# Patient Record
Sex: Female | Born: 1985 | Race: Black or African American | Hispanic: No | Marital: Married | Smoking: Never smoker
Health system: Southern US, Community
[De-identification: ages and names within clinical notes are randomized; demographics above are authoritative.]

## PROBLEM LIST (undated history)

## (undated) DIAGNOSIS — D219 Benign neoplasm of connective and other soft tissue, unspecified: Secondary | ICD-10-CM

## (undated) HISTORY — PX: NO PAST SURGERIES: SHX2092

---

## 2018-01-06 DIAGNOSIS — O0991 Supervision of high risk pregnancy, unspecified, first trimester: Secondary | ICD-10-CM | POA: Insufficient documentation

## 2018-01-13 LAB — OB RESULTS CONSOLE HGB/HCT, BLOOD
HCT: 37
Hemoglobin: 11.9

## 2018-01-13 LAB — OB RESULTS CONSOLE HIV ANTIBODY (ROUTINE TESTING): HIV: NONREACTIVE

## 2018-01-13 LAB — OB RESULTS CONSOLE GC/CHLAMYDIA
CHLAMYDIA, DNA PROBE: NEGATIVE
Gonorrhea: NEGATIVE

## 2018-01-13 LAB — OB RESULTS CONSOLE PLATELET COUNT: PLATELETS: 295

## 2018-01-13 LAB — OB RESULTS CONSOLE HEPATITIS B SURFACE ANTIGEN: Hepatitis B Surface Ag: NEGATIVE

## 2018-01-13 LAB — OB RESULTS CONSOLE RUBELLA ANTIBODY, IGM: RUBELLA: IMMUNE

## 2018-01-13 LAB — OB RESULTS CONSOLE VARICELLA ZOSTER ANTIBODY, IGG: VARICELLA IGG: IMMUNE

## 2018-01-13 LAB — OB RESULTS CONSOLE RPR: RPR: NONREACTIVE

## 2018-01-26 ENCOUNTER — Emergency Department
Admission: EM | Admit: 2018-01-26 | Discharge: 2018-01-27 | Disposition: A | Discharge status: Home / Self Care | Payer: 59 | Attending: Student in an Organized Health Care Education/Training Program | Admitting: Student in an Organized Health Care Education/Training Program

## 2018-01-26 DIAGNOSIS — O26852 Spotting complicating pregnancy, second trimester: Secondary | ICD-10-CM | POA: Diagnosis not present

## 2018-01-26 DIAGNOSIS — O4690 Antepartum hemorrhage, unspecified, unspecified trimester: Secondary | ICD-10-CM

## 2018-01-26 DIAGNOSIS — Z3A13 13 weeks gestation of pregnancy: Secondary | ICD-10-CM | POA: Insufficient documentation

## 2018-01-26 DIAGNOSIS — R102 Pelvic and perineal pain unspecified side: Secondary | ICD-10-CM

## 2018-01-26 DIAGNOSIS — O200 Threatened abortion: Secondary | ICD-10-CM | POA: Diagnosis not present

## 2018-01-26 DIAGNOSIS — D259 Leiomyoma of uterus, unspecified: Secondary | ICD-10-CM | POA: Diagnosis not present

## 2018-01-26 DIAGNOSIS — O9989 Other specified diseases and conditions complicating pregnancy, childbirth and the puerperium: Secondary | ICD-10-CM | POA: Diagnosis present

## 2018-01-26 HISTORY — DX: Benign neoplasm of connective and other soft tissue, unspecified: D21.9

## 2018-01-26 LAB — BASIC METABOLIC PANEL
ANION GAP: 8 (ref 5–15)
BUN: <5 mg/dL — ABNORMAL LOW (ref 6–20)
CALCIUM: 8.7 mg/dL — AB (ref 8.9–10.3)
CO2: 21 mmol/L — ABNORMAL LOW (ref 22–32)
Chloride: 103 mmol/L (ref 101–111)
Creatinine, Ser: 0.54 mg/dL (ref 0.44–1.00)
GFR calc Af Amer: >60 mL/min (ref >60)
GFR calc non Af Amer: >60 mL/min (ref >60)
GLUCOSE: 97 mg/dL (ref 65–99)
POTASSIUM: 3.2 mmol/L — AB (ref 3.5–5.1)
Sodium: 132 mmol/L — ABNORMAL LOW (ref 135–145)

## 2018-01-26 LAB — POCT PREGNANCY, URINE: PREG TEST UR: POSITIVE — AB

## 2018-01-26 LAB — ABO/RH: ABO/RH(D): A NEG

## 2018-01-26 LAB — CBC
HEMATOCRIT: 35.8 % (ref 35.0–47.0)
HEMOGLOBIN: 11.9 g/dL — AB (ref 12.0–16.0)
MCH: 29.2 pg (ref 26.0–34.0)
MCHC: 33.4 g/dL (ref 32.0–36.0)
MCV: 87.3 fL (ref 80.0–100.0)
Platelets: 284 10*3/uL (ref 150–440)
RBC: 4.09 MIL/uL (ref 3.80–5.20)
RDW: 13.1 % (ref 11.5–14.5)
WBC: 13.4 10*3/uL — ABNORMAL HIGH (ref 3.6–11.0)

## 2018-01-26 LAB — HCG, QUANTITATIVE, PREGNANCY: HCG, BETA CHAIN, QUANT, S: 207929 m[IU]/mL — AB (ref <5)

## 2018-01-26 MED ORDER — ACETAMINOPHEN 325 MG PO TABS
650.0000 mg | ORAL_TABLET | Freq: Once | ORAL | Status: AC
  Administered 2018-01-26: 650 mg via ORAL
  Filled 2018-01-26: qty 2

## 2018-01-26 NOTE — ED Provider Notes (Signed)
[facility] Emergency Department Provider Note    First MD Initiated Contact with Patient 01/26/18 2231     (approximate)  I have reviewed the triage vital signs and the nursing notes.   HISTORY  Chief Complaint Vaginal Bleeding ([redacted] weeks pregnant)    HPI Catherine Mclaughlin is a 32 y.o. female G2P1 [redacted] weeks pregnant presents for pelvic cramping that started over the weekend and is now having light bleeding.  Denies any fevers no nausea or vomiting.  States that the pain is mild to moderate.  Denies any trauma.  Is never had pain like this before.  Tylenol is not improving the pain.  Is not passing any clots or products of conception.  Past Medical History:  Diagnosis Date  . Fibroids    No family history on file. History reviewed. No pertinent surgical history. There are no active problems to display for this patient.     Prior to Admission medications   Not on File    Allergies Patient has no known allergies.    Social History Social History   Tobacco Use  . Smoking status: Never Smoker  . Smokeless tobacco: Never Used  Substance Use Topics  . Alcohol use: Yes    Frequency: Never    Comment: occassional  . Drug use: Not on file    Review of Systems Patient denies headaches, rhinorrhea, blurry vision, numbness, shortness of breath, chest pain, edema, cough, abdominal pain, nausea, vomiting, diarrhea, dysuria, fevers, rashes or hallucinations unless otherwise stated above in HPI. ____________________________________________   PHYSICAL EXAM:  VITAL SIGNS: Vitals:   01/26/18 2127  BP: 134/88  Pulse: 94  Resp: 18  Temp: 99.6 F (37.6 C)  SpO2: 99%    Constitutional: Alert and oriented. Well appearing and in no acute distress. Eyes: Conjunctivae are normal.  Head: Atraumatic. Nose: No congestion/rhinnorhea. Mouth/Throat: Mucous membranes are moist.   Neck: No stridor. Painless ROM.  Cardiovascular: Normal rate, regular  rhythm. Grossly normal heart sounds.  Good peripheral circulation. Respiratory: Normal respiratory effort.  No retractions. Lungs CTAB. Gastrointestinal: Soft but with ttp to right lower quadrant. No distention. No abdominal bruits. No CVA tenderness. Genitourinary: deferred Musculoskeletal: No lower extremity tenderness nor edema.  No joint effusions. Neurologic:  Normal speech and language. No gross focal neurologic deficits are appreciated. No facial droop Skin:  Skin is warm, dry and intact. No rash noted. Psychiatric: Mood and affect are normal. Speech and behavior are normal.  ____________________________________________   LABS (all labs ordered are listed, but only abnormal results are displayed)  Results for orders placed or performed during the hospital encounter of 01/26/18 (from the past 24 hour(s))  ABO/Rh     Status: None   Collection Time: 01/26/18  9:24 PM  Result Value Ref Range   ABO/RH(D)      A NEG Performed at [facility], [address] [place], [place] [address]   CBC     Status: Abnormal   Collection Time: 01/26/18  9:24 PM  Result Value Ref Range   WBC 13.4 (H) 3.6 - 11.0 K/uL   RBC 4.09 3.80 - 5.20 MIL/uL   Hemoglobin 11.9 (L) 12.0 - 16.0 g/dL   HCT 35.8 35.0 - 47.0 %   MCV 87.3 80.0 - 100.0 fL   MCH 29.2 26.0 - 34.0 pg   MCHC 33.4 32.0 - 36.0 g/dL   RDW 13.1 11.5 - 14.5 %   Platelets 284 150 - 440 K/uL  Pregnancy, urine  POC     Status: Abnormal   Collection Time: 01/26/18  9:39 PM  Result Value Ref Range   Preg Test, Ur POSITIVE (A) NEGATIVE   ____________________________________________ ____________________________________________  RADIOLOGY  I personally reviewed all radiographic images ordered to evaluate for the above acute complaints and reviewed radiology reports and findings.  These findings were personally discussed with the patient.  Please see medical record for radiology  report.  ____________________________________________   PROCEDURES  Procedure(s) performed:  Procedures    Critical Care performed: no ____________________________________________   INITIAL IMPRESSION / ASSESSMENT AND PLAN / ED COURSE  Pertinent labs & imaging results that were available during my care of the patient were reviewed by me and considered in my medical decision making (see chart for details).  DDX: Threatened miscarriage, placental abruption, subchorionic hematoma, uterine rupture, appendicitis, torsion  Catherine Mclaughlin is a 32 y.o. who presents to the ED with vaginal bleeding in early pregnancy as described above.  Patient does have some tenderness particular on the right suprapubic region.  Bedside ultrasound is reassuring fetal heart tones however the patient is tender in the right suprapubic region.  No trauma to suggest abruption or rupture patient otherwise hemodynamically stable..  Will order formal ultrasound to further evaluate.  Patient will require signout to oncoming physician pending reassessment and follow-up of ultrasound results.      ____________________________________________   FINAL CLINICAL IMPRESSION(S) / ED DIAGNOSES  Final diagnoses:  Pelvic pain  Vaginal bleeding in pregnancy      NEW MEDICATIONS STARTED DURING THIS VISIT:  New Prescriptions   No medications on file     Note:  This document was prepared using Dragon voice recognition software and may include unintentional dictation errors.    Merlyn Lot, MD 01/27/18 [phone]

## 2018-01-26 NOTE — ED Notes (Signed)
Bedside US by EDP.  Fetus could be seen moving and kicking on [place].

## 2018-01-26 NOTE — ED Triage Notes (Signed)
Patient c/o light vaginal bleeding and lower abdominal pain beginning at approx 2000 today. Patient denies clots/heavy bleeding

## 2018-01-27 ENCOUNTER — Emergency Department: Payer: 59

## 2018-01-27 LAB — ANTIBODY SCREEN: Antibody Screen: NEGATIVE

## 2018-01-27 LAB — URINALYSIS, COMPLETE (UACMP) WITH MICROSCOPIC
BILIRUBIN URINE: NEGATIVE
Glucose, UA: NEGATIVE mg/dL
Ketones, ur: NEGATIVE mg/dL
NITRITE: NEGATIVE
PH: 6 (ref 5.0–8.0)
Protein, ur: NEGATIVE mg/dL
SPECIFIC GRAVITY, URINE: 1.004 — AB (ref 1.005–1.030)

## 2018-01-27 MED ORDER — RHO D IMMUNE GLOBULIN 1500 UNIT/2ML IJ SOSY
300.0000 ug | PREFILLED_SYRINGE | Freq: Once | INTRAMUSCULAR | Status: DC
  Filled 2018-01-27: qty 2

## 2018-01-27 MED ORDER — FENTANYL CITRATE (PF) 100 MCG/2ML IJ SOLN: 50.0000 ug | INTRAMUSCULAR | Status: DC | PRN

## 2018-01-27 MED ORDER — RHO D IMMUNE GLOBULIN 1500 UNIT/2ML IJ SOSY
300.0000 ug | PREFILLED_SYRINGE | Freq: Once | INTRAMUSCULAR | Status: AC
  Administered 2018-01-27: 300 ug via INTRAMUSCULAR
  Filled 2018-01-27: qty 2

## 2018-01-27 NOTE — ED Notes (Signed)
Pt to MRI

## 2018-01-27 NOTE — ED Provider Notes (Signed)
-----------------------------------------   6:18 AM on 01/27/2018 -----------------------------------------   Blood pressure 110/71, pulse 82, temperature 99.6 F (37.6 C), temperature source Oral, resp. rate 17, height 5\' 4"  (1.626 m), weight 65.8 kg (145 lb), last menstrual period 10/30/2017, SpO2 99 %.  Assuming care from Dr. Quentin Cornwall.  In short, Catherine Mclaughlin is a 32 y.o. female with a chief complaint of Vaginal Bleeding ([redacted] weeks pregnant) .  Refer to the original H&P for additional details.  The current plan of care is to follow up the ultrasound and disposition the patient appropriately.  Pelvic ultrasound: Single live intrauterine pregnancy estimated gestational age 39 days 1 week, no subchorionic hemorrhage, normal left ovary with blood flow, right ovary not visualized, multiple uterine fibroids including a large fundal fibroid  The patient's ultrasound was unremarkable aside from fibroids.  Dr. Quentin Cornwall had been turned about the patient's right lower quadrant pain.  He did recommend a possible MRI if the patient's ultrasound was negative.  I did order an abdominal MRI for the patient.  MRI abdomen: No evidence of acute appendicitis.  The patient will be discharged home to follow-up with her primary care physician as well as her OB/GYN.  The patient has no further complaints or concerns.        Loney Hering, MD 01/27/18 [phone]

## 2018-01-27 NOTE — ED Notes (Signed)
Spoke with pt regarding needing to start and IV for MRI.  Pt states she was not told about MRI by Dr. Quentin Cornwall and would like to speak with a physician first.  EDP notified.

## 2018-01-27 NOTE — ED Notes (Signed)
Patient is resting comfortably. 

## 2018-01-27 NOTE — Discharge Instructions (Signed)
Please follow-up with your OB/GYN or your midwife.  Please return with any other questions or concerns.

## 2018-01-28 LAB — RHOGAM INJECTION: Unit division: 0

## 2018-02-24 ENCOUNTER — Encounter: Payer: Self-pay | Admitting: Obstetrics and Gynecology

## 2018-02-24 ENCOUNTER — Ambulatory Visit (INDEPENDENT_AMBULATORY_CARE_PROVIDER_SITE_OTHER): Payer: 59 | Admitting: Obstetrics and Gynecology

## 2018-02-24 VITALS — BP 105/70 | HR 84 | Wt 147.3 lb

## 2018-02-24 DIAGNOSIS — O468X1 Other antepartum hemorrhage, first trimester: Secondary | ICD-10-CM

## 2018-02-24 DIAGNOSIS — Z8742 Personal history of other diseases of the female genital tract: Secondary | ICD-10-CM

## 2018-02-24 DIAGNOSIS — D259 Leiomyoma of uterus, unspecified: Secondary | ICD-10-CM | POA: Insufficient documentation

## 2018-02-24 DIAGNOSIS — O418X10 Other specified disorders of amniotic fluid and membranes, first trimester, not applicable or unspecified: Secondary | ICD-10-CM

## 2018-02-24 DIAGNOSIS — O3412 Maternal care for benign tumor of corpus uteri, second trimester: Secondary | ICD-10-CM

## 2018-02-24 DIAGNOSIS — Z6791 Unspecified blood type, Rh negative: Secondary | ICD-10-CM

## 2018-02-24 DIAGNOSIS — O0992 Supervision of high risk pregnancy, unspecified, second trimester: Secondary | ICD-10-CM

## 2018-02-24 DIAGNOSIS — O26899 Other specified pregnancy related conditions, unspecified trimester: Secondary | ICD-10-CM

## 2018-02-24 LAB — POCT URINALYSIS DIPSTICK
Bilirubin, UA: NEGATIVE
GLUCOSE UA: NEGATIVE
Ketones, UA: NEGATIVE
Nitrite, UA: NEGATIVE
Spec Grav, UA: 1.02 (ref 1.010–1.025)
Urobilinogen, UA: 0.2 E.U./dL
pH, UA: 6.5 (ref 5.0–8.0)

## 2018-02-24 NOTE — Patient Instructions (Signed)
Second Trimester of Pregnancy The second trimester is from week 13 through week 28, month 4 through 6. This is often the time in pregnancy that you feel your best. Often times, morning sickness has lessened or quit. You may have more energy, and you may get hungry more often. Your unborn baby (fetus) is growing rapidly. At the end of the sixth month, he or she is about 9 inches long and weighs about 1 pounds. You will likely feel the baby move (quickening) between 18 and 20 weeks of pregnancy. Follow these instructions at home:  Avoid all smoking, herbs, and alcohol. Avoid drugs not approved by your doctor.  Do not use any tobacco products, including cigarettes, chewing tobacco, and electronic cigarettes. If you need help quitting, ask your doctor. You may get counseling or other support to help you quit.  Only take medicine as told by your doctor. Some medicines are safe and some are not during pregnancy.  Exercise only as told by your doctor. Stop exercising if you start having cramps.  Eat regular, healthy meals.  Wear a good support bra if your breasts are tender.  Do not use hot tubs, steam rooms, or saunas.  Wear your seat belt when driving.  Avoid raw meat, uncooked cheese, and liter boxes and soil used by cats.  Take your prenatal vitamins.  Take 1500-2000 milligrams of calcium daily starting at the 20th week of pregnancy until you deliver your baby.  Try taking medicine that helps you poop (stool softener) as needed, and if your doctor approves. Eat more fiber by eating fresh fruit, vegetables, and whole grains. Drink enough fluids to keep your pee (urine) clear or pale yellow.  Take warm water baths (sitz baths) to soothe pain or discomfort caused by hemorrhoids. Use hemorrhoid cream if your doctor approves.  If you have puffy, bulging veins (varicose veins), wear support hose. Raise (elevate) your feet for 15 minutes, 3-4 times a day. Limit salt in your diet.  Avoid heavy  lifting, wear low heals, and sit up straight.  Rest with your legs raised if you have leg cramps or low back pain.  Visit your dentist if you have not gone during your pregnancy. Use a soft toothbrush to brush your teeth. Be gentle when you floss.  You can have sex (intercourse) unless your doctor tells you not to.  Go to your doctor visits. Get help if:  You feel dizzy.  You have mild cramps or pressure in your lower belly (abdomen).  You have a nagging pain in your belly area.  You continue to feel sick to your stomach (nauseous), throw up (vomit), or have watery poop (diarrhea).  You have bad smelling fluid coming from your vagina.  You have pain with peeing (urination). Get help right away if:  You have a fever.  You are leaking fluid from your vagina.  You have spotting or bleeding from your vagina.  You have severe belly cramping or pain.  You lose or gain weight rapidly.  You have trouble catching your breath and have chest pain.  You notice sudden or extreme puffiness (swelling) of your face, hands, ankles, feet, or legs.  You have not felt the baby move in over an hour.  You have severe headaches that do not go away with medicine.  You have vision changes. This information is not intended to replace advice given to you by your health care provider. Make sure you discuss any questions you have with your health care   provider. Document Released: 02/05/2010 Document Revised: 04/18/2016 Document Reviewed: 01/12/2013 Elsevier Interactive Patient Education  2017 Elsevier Inc.  

## 2018-02-24 NOTE — Progress Notes (Signed)
OB pt noticed bleeding about a month ago. She has fibroids and wondering how they would impact pregnancy.

## 2018-02-24 NOTE — Progress Notes (Signed)
TRANSFER IN OB HISTORY AND PHYSICAL         Shiree Altemus is a 32 y.o. G2P1 female, Patient's last menstrual period was 10/30/2017., Estimated Date of Delivery: 08/06/18 (consistent with 7 week ultrasound), [redacted]w[redacted]d, presents today for Transition of Prenatal Care. EPIC data migration from outside records is accomplished today.  She was previously receiving prenatal care at [facility]. Conceived with assisted reproduction (ovulation induction and IUI performed at [facility]).  Complaints today include: None.     Patient Active Problem List   Diagnosis Date Noted  . Subchorionic hemorrhage in first trimester 02/28/2018  . History of infertility 02/28/2018  . Uterine fibroids affecting pregnancy in second trimester 02/24/2018  . Rh negative state in antepartum period 02/24/2018  . Supervision of high risk pregnancy in first trimester 01/06/2018    Gynecologic History Patient's last menstrual period was 10/30/2017. Normal Contraception: none Last Pap: 04/2017. Results were: normal.  She denies h/o abnormal pap smears.  Denies h/o STI's.   Obstetric History OB History  Gravida Para Term Preterm AB Living  2 0     1    SAB TAB Ectopic Multiple Live Births    1          # Outcome Date GA Lbr Len/2nd Weight Sex Delivery Anes PTL Lv  2 Current           1 TAB 2009            Past Medical History:  Diagnosis Date  . Fibroids     Past Surgical History:  Procedure Laterality Date  . NO PAST SURGERIES      Family History  Problem Relation Age of Onset  . Diabetes Mother   . Hypertension Mother   . Diabetes Father   . Stroke Father   . Asthma Sister   . Stroke Paternal Grandfather     Social History   Socioeconomic History  . Marital status: Married    Spouse name: Not on file  . Number of children: Not on file  . Years of education: Not on file  . Highest education level: Not on file  Occupational History  . Not on file  Social Needs  . Financial resource strain: Not  on file  . Food insecurity:    Worry: Not on file    Inability: Not on file  . Transportation needs:    Medical: Not on file    Non-medical: Not on file  Tobacco Use  . Smoking status: Never Smoker  . Smokeless tobacco: Never Used  Substance and Sexual Activity  . Alcohol use: Yes    Frequency: Never    Comment: occassional  . Drug use: Not on file  . Sexual activity: Not on file  Lifestyle  . Physical activity:    Days per week: Not on file    Minutes per session: Not on file  . Stress: Not on file  Relationships  . Social connections:    Talks on phone: Not on file    Gets together: Not on file    Attends religious service: Not on file    Active member of club or organization: Not on file    Attends meetings of clubs or organizations: Not on file    Relationship status: Not on file  . Intimate partner violence:    Fear of current or ex partner: Not on file    Emotionally abused: Not on file    Physically abused: Not on file  Forced sexual activity: Not on file  Other Topics Concern  . Not on file  Social History Narrative  . Not on file    Current Outpatient Medications on File Prior to Visit  Medication Sig Dispense Refill  . Prenatal Vit-Fe Fumarate-FA (PRENATAL MULTIVITAMIN) TABS tablet Take 1 tablet by mouth daily at 12 noon.     No current facility-administered medications on file prior to visit.     No Known Allergies   The following portions of the patient's history were reviewed and updated as appropriate: allergies, current medications, past OB history, past medical history, past surgical history, past family history, past social history, and problem list.    Review of Symptoms General:Not Present- Fever, Weight Loss and Weight Gain. Skin:Not Present- Rash. HEENT:Not Present- Blurred Vision, Headache and Bleeding Gums. Respiratory:Not Present- Difficulty Breathing. Breast:Not Present- Breast Mass. Cardiovascular:Not Present- Chest Pain,  Elevated Blood Pressure, Fainting / Blacking Out and Shortness of Breath. Gastrointestinal:Not Present- Abdominal Pain, Constipation, Nausea and Vomiting. Female Genitourinary:Vaginal Bleeding (patient notes an episode ~ 1 month ago, went to the Emergency Room, was noted to have subchorionic hemorrhage).  Received Rhogam. Not Present- Frequency, Painful Urination, Pelvic Pain, Vaginal Discharge, Contractions, regular, Fetal Movements Decreased, Urinary Complaints and Vaginal Fluid. Musculoskeletal:Not Present- Back Pain and Leg Cramps. Neurological:Not Present- Dizziness. Psychiatric:Not Present- Depression.    OBJECTIVE: Initial Physical Exam (New OB)  GENERAL APPEARANCE: alert, well appearing, in no apparent distress HEAD: normocephalic, atraumatic MOUTH: mucous membranes moist, pharynx normal without lesions THYROID: no thyromegaly or masses present BREASTS: no masses noted, no significant tenderness, no palpable axillary nodes, no skin changes LUNGS: clear to auscultation, no wheezes, rales or rhonchi, symmetric air entry HEART: regular rate and rhythm, no murmurs ABDOMEN: soft, nontender, nondistended, no epigastric pain. FHT 149 bpm.  FH 24 cm, with irregular contour, deviated slightly to right. Marland Kitchen  EXTREMITIES: no redness or tenderness in the calves or thighs SKIN: normal coloration and turgor, no rashes LYMPH NODES: no adenopathy palpable NEUROLOGIC: alert, oriented, normal speech, no focal findings or movement disorder noted  PELVIC EXAM: Deferred    Labs: Reviewed in Care Everywhere   Imaging:  CLINICAL DATA:  Pregnant patient in first-trimester pregnancy with lower abdominal pain and vaginal bleeding.  EXAM: OBSTETRIC <14 WK [place]  US DOPPLER ULTRASOUND OF OVARIES  TECHNIQUE: Transabdominal ultrasound examination was performed for complete evaluation of the gestation as well as the maternal uterus, adnexal regions, and pelvic cul-de-sac.  Color and duplex  Doppler ultrasound was utilized to evaluate blood flow to the ovaries.  COMPARISON:  None.  FINDINGS: Intrauterine gestational sac: Single  Yolk sac:  Not Visualized, normal for gestational age.  Embryo:  Visualized.  Cardiac Activity: Visualized.  Heart Rate: 169 bpm  CRL:   70 mm   13 w 1 d                  [place] EDC: 08/03/2018  Subchorionic hemorrhage:  None visualized.  Maternal uterus/adnexae: There are multiple uterine fibroids, largest about the fundus measuring 9 x 8.7 x 12.6 cm. Probable exophytic fibroid in the midline cul-de-sac measuring 5.1 x 4.6 x 5.3 cm. Larger fibroids demonstrate expected and normal blood flow. the right ovary is not visualized. The left ovary is normal measuring 3.0 x 2.0 x 2.4 cm with normal ovarian blood flow. Trace pelvic free fluid. No adnexal mass.  Pulsed Doppler evaluation of the left ovary demonstrates normal appearing low-resistance arterial and venous waveforms.  IMPRESSION: 1. Single live  intrauterine pregnancy estimated gestational age [redacted] weeks 1 day based on crown-rump length for estimated date of delivery 08/03/2018. No subchorionic hemorrhage. 2. Normal left ovary with blood flow.  Right ovary not visualized. 3. Multiple uterine fibroids including a large fundal fibroid measuring up to 12 cm.   Electronically Signed   By: Jeb Levering M.D.   On: 01/27/2018 01:45   ASSESSMENT: Pregnancy at [redacted] weeks gestation H/o infertility Fibroid uterus Rh negative state H/o subchorionic hemorrhage in 1st trimester   PLAN:  1. Pregnancy at [redacted] weeks gestation - Initial labs drawn. - Prenatal vitamins. - Problem list reviewed and updated. - Genetic testing discussed.  Initially patient had declined genetic testing, however after further discussion patient and husband agree to testing today.  Desire cell-free DNA testing. Panorama ordered.  Role of ultrasound in pregnancy discussed; fetal survey: ordered. -  Encouraged patient to consider breastfeeding postpartum.   2. H/o infertility  - Pregnancy conceived from IUI and ovulation induction.  Currently out of 1st trimester where most miscarriages occur. Will continue to monitor.   3. Fibroid uterus - Patient with enlarged fibroid uterus.  Discussed that she will need serial growth scan q 4-6 weeks to monitor fetal growth after 20 weeks. Discussed issues regarding large fibroids, location, and how this can affect mode of delivery.  Can determine this further during the pregnancy.  Also discussed risk of premature/preterm labor due to size constraints if pregnancy continues on.   4. Rh negative state  - Patient received Rhogam in 1st trimester due to bleeding (likely secondary to subchorionic hemorrhage).  Will give again in third trimester. Discussed blood type with patient.   5. H/o subchorionic hemorrhage in 1st trimester  - Apparently resolved on last scan performed on 01/27/2018. Patient should not have any further episodes of bleeding.    Follow up in 4 weeks.  50% of 30 min visit spent on counseling and coordination of care.    Rubie Maid, MD [facility]

## 2018-02-28 ENCOUNTER — Encounter: Payer: Self-pay | Admitting: Obstetrics and Gynecology

## 2018-02-28 DIAGNOSIS — O208 Other hemorrhage in early pregnancy: Secondary | ICD-10-CM | POA: Insufficient documentation

## 2018-02-28 DIAGNOSIS — Z8742 Personal history of other diseases of the female genital tract: Secondary | ICD-10-CM | POA: Insufficient documentation

## 2018-02-28 DIAGNOSIS — O468X1 Other antepartum hemorrhage, first trimester: Secondary | ICD-10-CM

## 2018-02-28 DIAGNOSIS — O418X10 Other specified disorders of amniotic fluid and membranes, first trimester, not applicable or unspecified: Secondary | ICD-10-CM | POA: Insufficient documentation

## 2018-03-24 ENCOUNTER — Ambulatory Visit (INDEPENDENT_AMBULATORY_CARE_PROVIDER_SITE_OTHER): Payer: 59

## 2018-03-24 ENCOUNTER — Ambulatory Visit (INDEPENDENT_AMBULATORY_CARE_PROVIDER_SITE_OTHER): Payer: 59 | Admitting: Obstetrics and Gynecology

## 2018-03-24 ENCOUNTER — Encounter: Payer: Self-pay | Admitting: Obstetrics and Gynecology

## 2018-03-24 VITALS — BP 106/71 | HR 85 | Wt 147.1 lb

## 2018-03-24 DIAGNOSIS — B3731 Acute candidiasis of vulva and vagina: Secondary | ICD-10-CM

## 2018-03-24 DIAGNOSIS — O0992 Supervision of high risk pregnancy, unspecified, second trimester: Secondary | ICD-10-CM

## 2018-03-24 DIAGNOSIS — B373 Candidiasis of vulva and vagina: Secondary | ICD-10-CM

## 2018-03-24 LAB — POCT URINALYSIS DIPSTICK
BILIRUBIN UA: NEGATIVE
Glucose, UA: NEGATIVE
Ketones, UA: NEGATIVE
Nitrite, UA: NEGATIVE
ODOR: NEGATIVE
PH UA: 6 (ref 5.0–8.0)
Protein, UA: NEGATIVE
SPEC GRAV UA: 1.015 (ref 1.010–1.025)
UROBILINOGEN UA: 0.2 U/dL

## 2018-03-24 NOTE — Progress Notes (Signed)
Catherine Mclaughlin and ant- c/o green d/c x 2 weeks- 2-3 days a week. No odor. No itching or burning.

## 2018-03-24 NOTE — Progress Notes (Signed)
ROB: Ultrasound today -large uterine fibroids.  Patient reports active fetal movement.  Complains of a thick green vaginal discharge otherwise asymptomatic. WET PREP: clue cells: absent, KOH (yeast): positive, odor: absent and trichomoniasis: negative Ph:  < 4.5 Discussed the use of Monistat.

## 2018-03-26 ENCOUNTER — Telehealth: Payer: Self-pay | Admitting: Obstetrics and Gynecology

## 2018-03-26 NOTE — Telephone Encounter (Signed)
The patient called and stated that she is experiencing Severe cramping that has been continuing to happen for some time now. The patient is currently 21 wks preg, and would like to speak with a  Nurse as soon as possible. Please advise.

## 2018-03-26 NOTE — Telephone Encounter (Signed)
OB- c/o of the last 2 days with intense/ sharp pain that comes and goes. Can last up to 30 minutes. She does have abd pain with urination and bm. Dx with y/i on Monday at rob appt. Has not gotten monistat. NO vb. Pos FM. Pos h20. No n/v/d. Took 1 tylenol with no relief. Advised pt to self treat with monistat. Push fluids. Use belly band. Try tylenol 2 es q6. If no better in 72 hours contact office for an appt.

## 2018-04-21 ENCOUNTER — Ambulatory Visit (INDEPENDENT_AMBULATORY_CARE_PROVIDER_SITE_OTHER): Payer: 59 | Admitting: Obstetrics and Gynecology

## 2018-04-21 VITALS — BP 105/69 | HR 102 | Wt 153.1 lb

## 2018-04-21 DIAGNOSIS — Z130 Encounter for screening for diseases of the blood and blood-forming organs and certain disorders involving the immune mechanism: Secondary | ICD-10-CM

## 2018-04-21 DIAGNOSIS — D259 Leiomyoma of uterus, unspecified: Secondary | ICD-10-CM

## 2018-04-21 DIAGNOSIS — O99619 Diseases of the digestive system complicating pregnancy, unspecified trimester: Secondary | ICD-10-CM

## 2018-04-21 DIAGNOSIS — K219 Gastro-esophageal reflux disease without esophagitis: Secondary | ICD-10-CM

## 2018-04-21 DIAGNOSIS — O3410 Maternal care for benign tumor of corpus uteri, unspecified trimester: Secondary | ICD-10-CM

## 2018-04-21 DIAGNOSIS — Z3482 Encounter for supervision of other normal pregnancy, second trimester: Secondary | ICD-10-CM

## 2018-04-21 DIAGNOSIS — Z113 Encounter for screening for infections with a predominantly sexual mode of transmission: Secondary | ICD-10-CM

## 2018-04-21 DIAGNOSIS — Z131 Encounter for screening for diabetes mellitus: Secondary | ICD-10-CM

## 2018-04-21 LAB — POCT URINALYSIS DIPSTICK
Bilirubin, UA: NEGATIVE
Blood, UA: NEGATIVE
Glucose, UA: NEGATIVE
Ketones, UA: NEGATIVE
NITRITE UA: NEGATIVE
PH UA: 6 (ref 5.0–8.0)
PROTEIN UA: NEGATIVE
SPEC GRAV UA: 1.01 (ref 1.010–1.025)
UROBILINOGEN UA: 0.2 U/dL

## 2018-04-21 MED ORDER — RANITIDINE HCL 150 MG PO TABS: 150.0000 mg | ORAL_TABLET | Freq: Two times a day (BID) | ORAL | 4 refills | Status: AC

## 2018-04-21 NOTE — Progress Notes (Signed)
ROB-pt stated that she has had some pain across both sides of her body, with leg cramps. Pt feeling tried all the time.

## 2018-04-21 NOTE — Progress Notes (Signed)
ROB: Patient c/o mild occasional crampy pain, also cramps in legs. Advised on hydration. Taking Tums for reflux and leg cramps, but not helping as much for reflux. Prescribed Zantac. RTC in 1.5 weeks for ultrasound (for large fibroid and 1 hr glucola. RTC in 4 weeks for OB visit.

## 2018-05-05 ENCOUNTER — Ambulatory Visit (INDEPENDENT_AMBULATORY_CARE_PROVIDER_SITE_OTHER): Payer: 59

## 2018-05-05 ENCOUNTER — Ambulatory Visit: Payer: 59

## 2018-05-05 DIAGNOSIS — D259 Leiomyoma of uterus, unspecified: Secondary | ICD-10-CM

## 2018-05-05 DIAGNOSIS — O3410 Maternal care for benign tumor of corpus uteri, unspecified trimester: Secondary | ICD-10-CM

## 2018-05-06 LAB — CBC
HEMATOCRIT: 30.6 % — AB (ref 34.0–46.6)
Hemoglobin: 10.2 g/dL — ABNORMAL LOW (ref 11.1–15.9)
MCH: 28.7 pg (ref 26.6–33.0)
MCHC: 33.3 g/dL (ref 31.5–35.7)
MCV: 86 fL (ref 79–97)
Platelets: 308 10*3/uL (ref 150–450)
RBC: 3.56 x10E6/uL — AB (ref 3.77–5.28)
RDW: 14.8 % (ref 12.3–15.4)
WBC: 11 10*3/uL — AB (ref 3.4–10.8)

## 2018-05-06 LAB — RPR: RPR Ser Ql: NONREACTIVE

## 2018-05-11 ENCOUNTER — Other Ambulatory Visit: Payer: Self-pay | Admitting: Obstetrics and Gynecology

## 2018-05-11 MED ORDER — DOCUSATE SODIUM 100 MG PO CAPS: 100.0000 mg | ORAL_CAPSULE | Freq: Two times a day (BID) | ORAL | 2 refills | Status: AC | PRN

## 2018-05-11 MED ORDER — FERROUS SULFATE 325 (65 FE) MG PO TABS: 325.0000 mg | ORAL_TABLET | Freq: Every day | ORAL | 1 refills | Status: AC

## 2018-05-13 ENCOUNTER — Telehealth: Payer: Self-pay | Admitting: Obstetrics and Gynecology

## 2018-05-13 NOTE — Telephone Encounter (Signed)
Spoke with pt and advised her to got the ED to be seen by L&D.

## 2018-05-13 NOTE — Telephone Encounter (Signed)
Patient would like to speak to the nurse about some symptoms; accellerated pulse, shortness of breath and a little lightheaded for 2-3 hours and SOB seems to be getting worse and she is resting; no activity to cause it.  No pain but intense [employer].  Please call her back at [phone]; does have voicemail.  Please advise, thanks.

## 2018-05-19 ENCOUNTER — Encounter: Payer: Self-pay | Admitting: Obstetrics and Gynecology

## 2018-05-19 ENCOUNTER — Ambulatory Visit (INDEPENDENT_AMBULATORY_CARE_PROVIDER_SITE_OTHER): Payer: 59 | Admitting: Obstetrics and Gynecology

## 2018-05-19 VITALS — BP 105/71 | HR 96 | Wt 155.4 lb

## 2018-05-19 DIAGNOSIS — O360130 Maternal care for anti-D [Rh] antibodies, third trimester, not applicable or unspecified: Secondary | ICD-10-CM

## 2018-05-19 DIAGNOSIS — R42 Dizziness and giddiness: Secondary | ICD-10-CM

## 2018-05-19 DIAGNOSIS — Z1389 Encounter for screening for other disorder: Secondary | ICD-10-CM

## 2018-05-19 DIAGNOSIS — O3413 Maternal care for benign tumor of corpus uteri, third trimester: Secondary | ICD-10-CM

## 2018-05-19 DIAGNOSIS — Z331 Pregnant state, incidental: Secondary | ICD-10-CM

## 2018-05-19 DIAGNOSIS — O3410 Maternal care for benign tumor of corpus uteri, unspecified trimester: Secondary | ICD-10-CM

## 2018-05-19 DIAGNOSIS — D259 Leiomyoma of uterus, unspecified: Secondary | ICD-10-CM

## 2018-05-19 DIAGNOSIS — Z3483 Encounter for supervision of other normal pregnancy, third trimester: Secondary | ICD-10-CM

## 2018-05-19 DIAGNOSIS — O9989 Other specified diseases and conditions complicating pregnancy, childbirth and the puerperium: Secondary | ICD-10-CM

## 2018-05-19 DIAGNOSIS — Z3A28 28 weeks gestation of pregnancy: Secondary | ICD-10-CM | POA: Diagnosis not present

## 2018-05-19 LAB — POCT URINALYSIS DIPSTICK
Bilirubin, UA: NEGATIVE
Blood, UA: NEGATIVE
GLUCOSE UA: NEGATIVE
Ketones, UA: NEGATIVE
NITRITE UA: NEGATIVE
PROTEIN UA: POSITIVE — AB
SPEC GRAV UA: 1.025 (ref 1.010–1.025)
Urobilinogen, UA: 0.2 E.U./dL
pH, UA: 6 (ref 5.0–8.0)

## 2018-05-19 MED ORDER — RHO D IMMUNE GLOBULIN 1500 UNIT/2ML IJ SOSY
300.0000 ug | PREFILLED_SYRINGE | Freq: Once | INTRAMUSCULAR | Status: AC
  Administered 2018-05-19: 300 ug via INTRAMUSCULAR

## 2018-05-19 MED ORDER — TETANUS-DIPHTH-ACELL PERTUSSIS 5-2.5-18.5 LF-MCG/0.5 IM SUSP
0.5000 mL | Freq: Once | INTRAMUSCULAR | Status: AC
  Administered 2018-05-19: 0.5 mL via INTRAMUSCULAR

## 2018-05-19 NOTE — Progress Notes (Signed)
ROB-pt stated having abd contractions that really hurts. Pt stated feeling dizzy and sob throughout the day.

## 2018-05-19 NOTE — Progress Notes (Signed)
ROB: Patient describes occasional Braxton Hicks contractions.  Signs and symptoms of preterm labor discussed in detail.  Ultrasound for uterine fibroid reveals fetal growth at the 52nd percentile.  Patient states her baby was breech but feels vertex by exam today.  Continue ultrasounds every 4 weeks for growth.

## 2018-05-22 ENCOUNTER — Encounter: Payer: 59 | Admitting: Obstetrics and Gynecology

## 2018-06-08 ENCOUNTER — Encounter: Payer: 59 | Admitting: Obstetrics and Gynecology

## 2018-06-08 ENCOUNTER — Other Ambulatory Visit: Payer: 59

## 2018-07-17 ENCOUNTER — Telehealth: Payer: Self-pay

## 2018-07-17 NOTE — Telephone Encounter (Signed)
Pt called no answer LM via voicemail informing pt of the office concerns about her not visiting the office for her prenatal care and asked the pt to please call the office to speak more about her not attending her appointments.

## 2018-08-06 ENCOUNTER — Inpatient Hospital Stay (HOSPITAL_COMMUNITY): Admit: 2018-08-06 | Payer: 59

## 2018-10-29 ENCOUNTER — Encounter (HOSPITAL_COMMUNITY): Payer: Self-pay

## 2019-11-04 IMAGING — MR MR ABDOMEN W/O CM
5 of 8 series · 23 of 48 positions shown · non-contrast
Comparison: None.

CLINICAL DATA: 31 y/o F G2P1 12 weeks pregnant: Abd pain, pregnant,
appendicitis suspected

EXAM:
MRI ABDOMEN WITHOUT CONTRAST
TECHNIQUE: Multiplanar multisequence MR imaging was performed without the
administration of intravenous contrast.

[Series 2: T2 · axial · 5.0mm · 0.74mm/px · z∈[-248,+166]mm · 5 of 70 slices shown (1 of 2)]
[im 1/70]
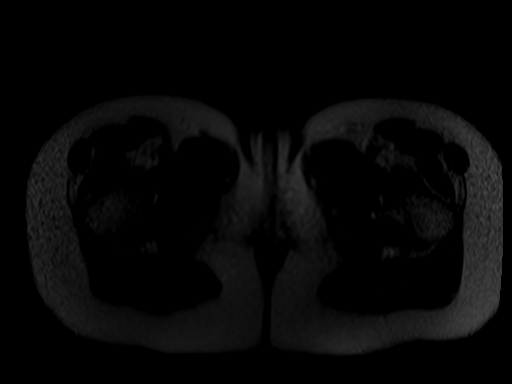
[im 18/70]
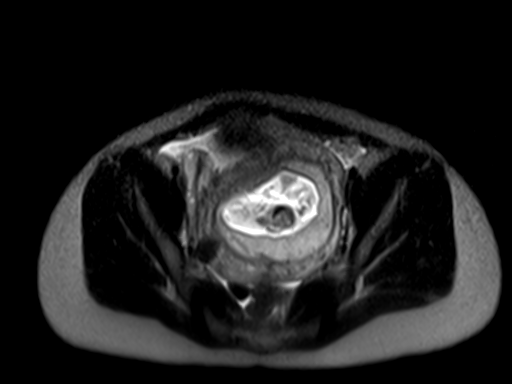
[im 35/70]
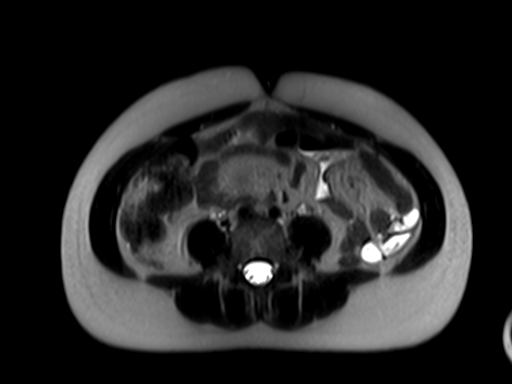
[im 52/70]
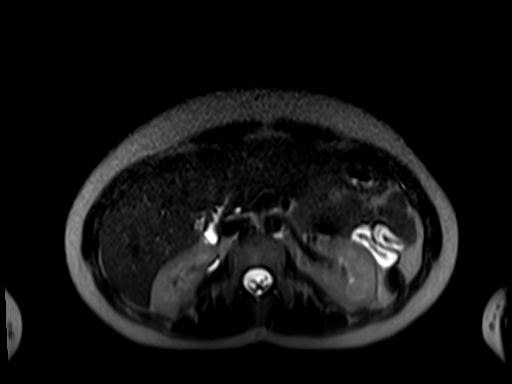
[im 70/70]
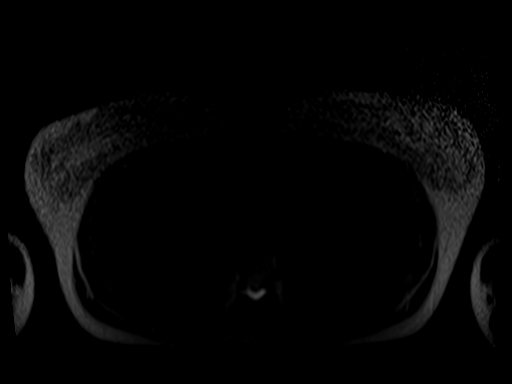

[Series 4: T2 fat-sat · axial · 5.0mm · 0.74mm/px · z∈[-248,+166]mm · 6 of 70 slices shown (1 of 2)]
[im 1/70]
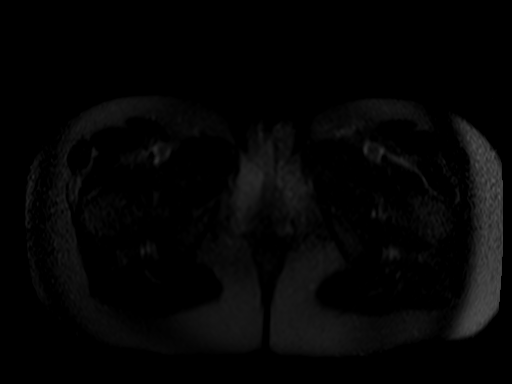
[im 14/70]
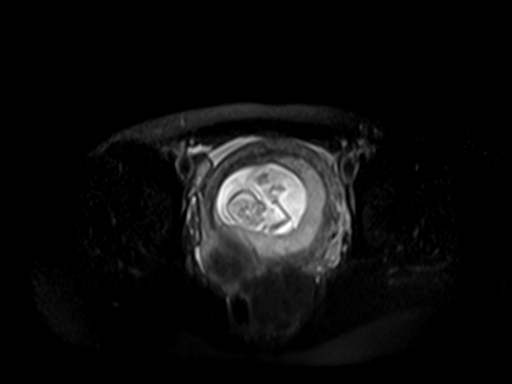
[im 28/70]
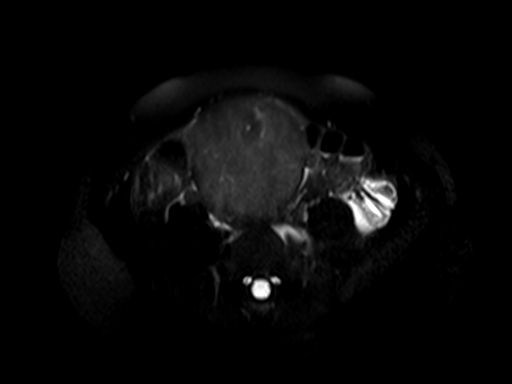
[im 42/70]
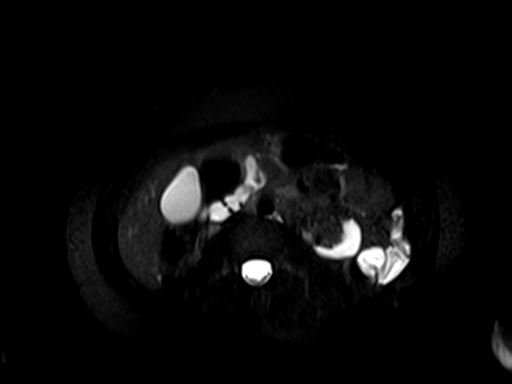
[im 56/70]
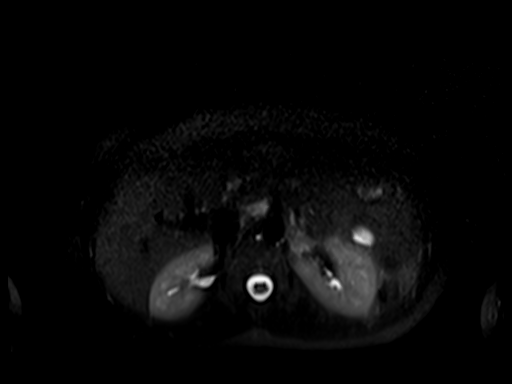
[im 70/70]
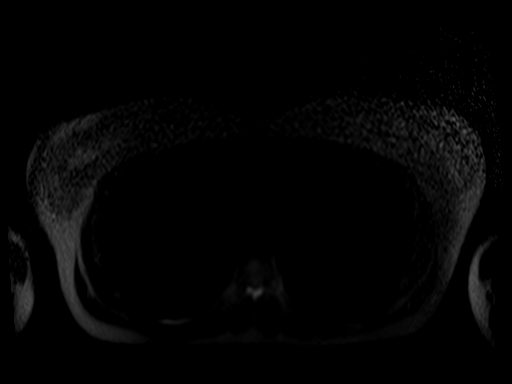

[Series 10: T2 · coronal · 6.0mm · 0.82mm/px · 3 of 32 slices shown (2 of 2)]
[im 1/32]
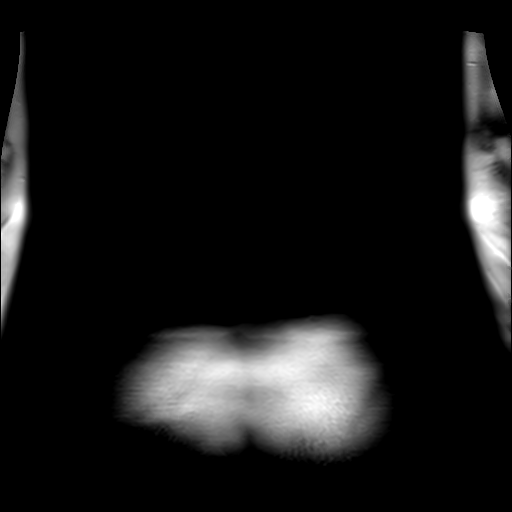
[im 16/32]
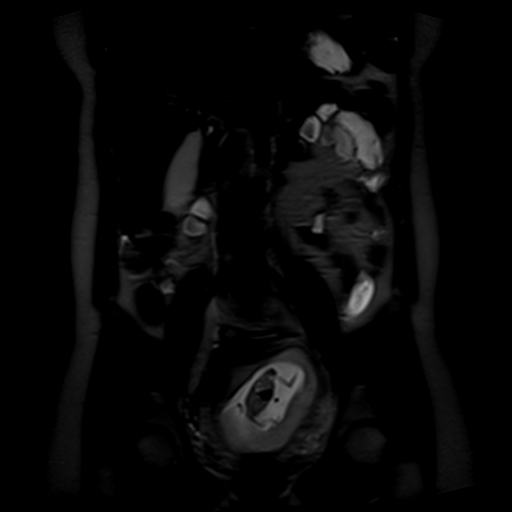
[im 32/32]
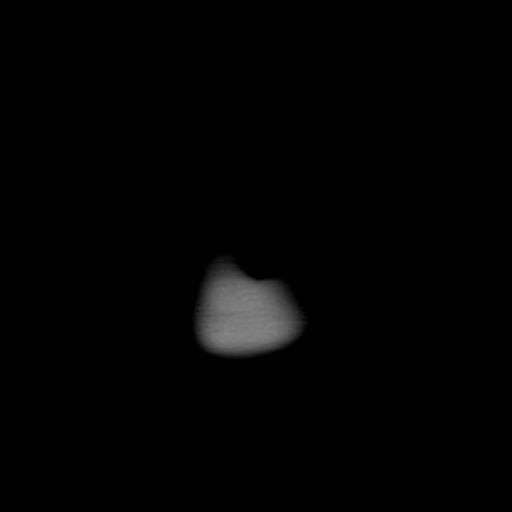

[Series 12: T2 fat-sat · coronal · 6.0mm · 0.82mm/px · 3 of 32 slices shown (2 of 2)]
[im 1/32]
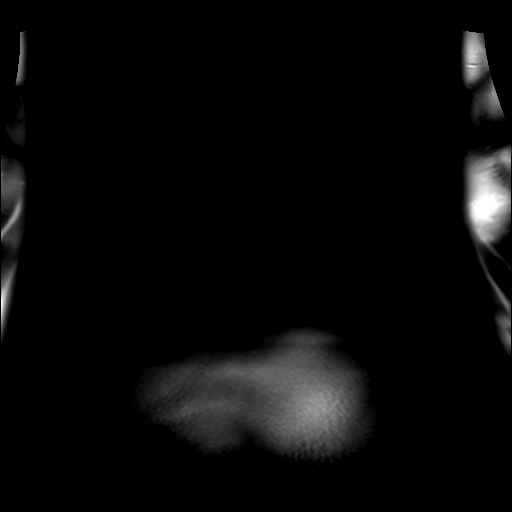
[im 16/32]
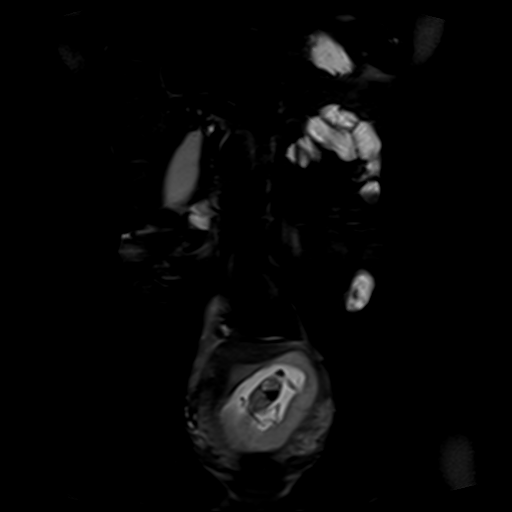
[im 32/32]
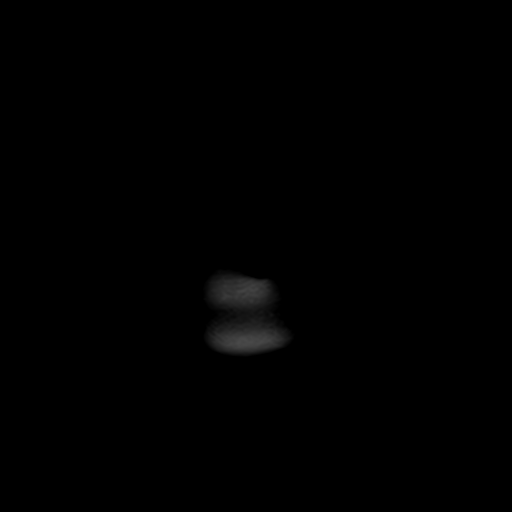

[Series 14: bSSFP · axial · 5.0mm · 0.70mm/px · z∈[-254,+172]mm · 6 of 72 slices shown]
[im 1/72]
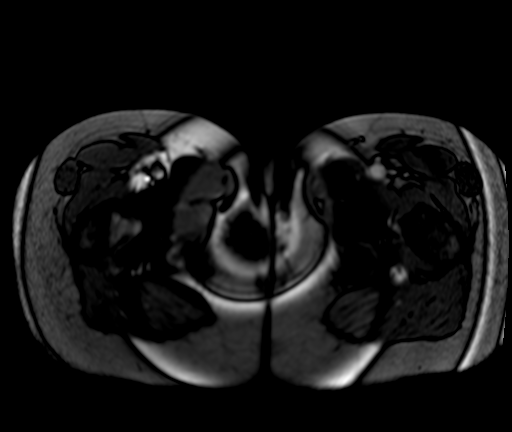
[im 15/72]
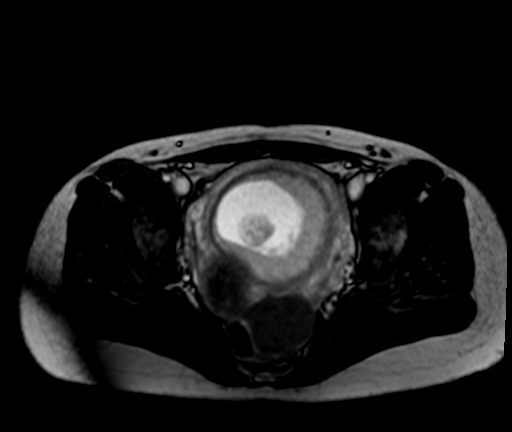
[im 29/72]
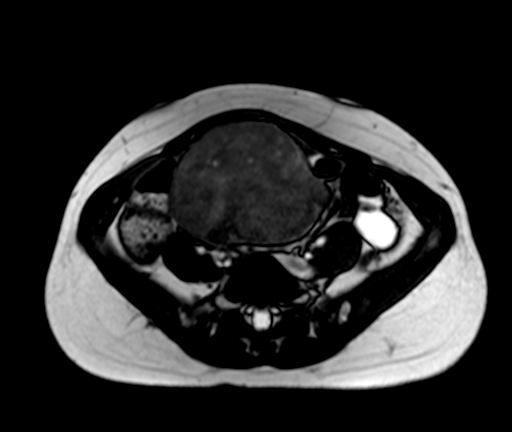
[im 43/72]
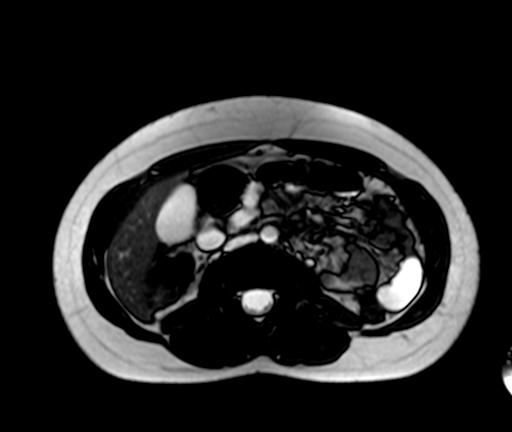
[im 57/72]
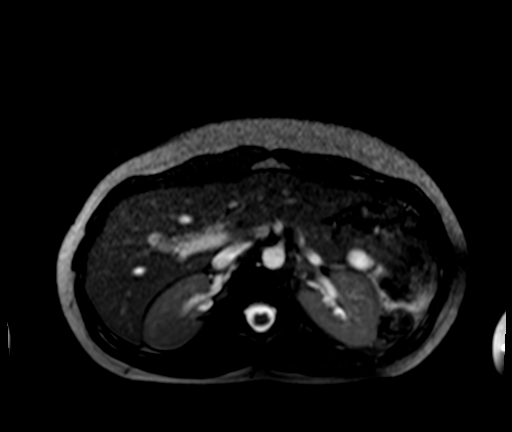
[im 72/72]
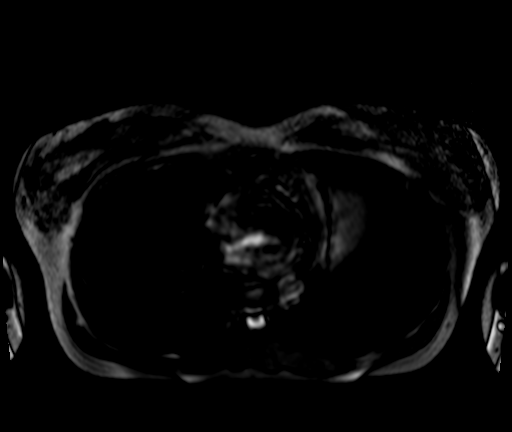

[23 of 48 positions shown; findings below may reference images not displayed]

FINDINGS: Lower chest: No acute findings.

Hepatobiliary: No mass or other parenchymal abnormality identified.

Pancreas: No mass, inflammatory changes, or other parenchymal
abnormality identified.

Spleen:  Within normal limits in size and appearance.

Adrenals/Urinary Tract: No masses identified. No evidence of
hydronephrosis.

Stomach/Bowel: No evidence of acute appendicitis. Suspected normal
appendix is best seen on series 10 image 12 and series 15 image 14.
The remainder of the visualized bowel is normal.

Vascular/Lymphatic: No pathologically enlarged lymph nodes
identified. No abdominal aortic aneurysm demonstrated.

Other: Gravid uterus. Normal left ovary. Multiple large uterine
fibroids. The largest is located at the fundus and measures
x 8.6 cm. There is a small amount of free fluid in the right upper
pelvis.

Musculoskeletal: No suspicious bone lesions identified.
IMPRESSION: 1. No evidence of acute appendicitis. Suspected normal appendix is
best seen on images [DATE] and [DATE].
2. Multiple uterine fibroids, including large fundal fibroid
measuring up to 10.6 cm.
3. Right ovary not clearly visualized.
